# Patient Record
Sex: Female | Born: 2000 | Race: Black or African American | Hispanic: No | Marital: Single | Smoking: Never smoker
Health system: Southern US, Community
[De-identification: ages and names within clinical notes are randomized; demographics above are authoritative.]

## PROBLEM LIST (undated history)

## (undated) DIAGNOSIS — F32A Depression, unspecified: Secondary | ICD-10-CM

## (undated) DIAGNOSIS — F419 Anxiety disorder, unspecified: Secondary | ICD-10-CM

## (undated) HISTORY — DX: Depression, unspecified: F32.A

## (undated) HISTORY — PX: WRIST SURGERY: SHX841

## (undated) HISTORY — DX: Anxiety disorder, unspecified: F41.9

---
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
## 2018-02-21 ENCOUNTER — Ambulatory Visit (INDEPENDENT_AMBULATORY_CARE_PROVIDER_SITE_OTHER): Payer: 59 | Admitting: Women's Health

## 2018-02-21 ENCOUNTER — Encounter: Payer: Self-pay | Admitting: Women's Health

## 2018-02-21 VITALS — BP 124/80 | Ht 66.0 in | Wt 158.0 lb

## 2018-02-21 DIAGNOSIS — Z30016 Encounter for initial prescription of transdermal patch hormonal contraceptive device: Secondary | ICD-10-CM | POA: Diagnosis not present

## 2018-02-21 DIAGNOSIS — Z01419 Encounter for gynecological examination (general) (routine) without abnormal findings: Secondary | ICD-10-CM

## 2018-02-21 MED ORDER — NORELGESTROMIN-ETH ESTRADIOL 150-35 MCG/24HR TD PTWK: MEDICATED_PATCH | TRANSDERMAL | 4 refills | Status: DC

## 2018-02-21 NOTE — Progress Notes (Signed)
Lynn Austin December 09, 2000 [id number]    History:    Presents for annual exam.  Monthly cycle on Ortho Evra patch with good relief of dysmenorrhea.  Virgin.  Gardasil series completed.  Past medical history, past surgical history, family history and social history were all reviewed and documented in the EPIC chart.  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  Paternal aunt recently deceased from metastatic breast cancer.  Parents healthy.  ROS:  A ROS was performed and pertinent positives and negatives are included.  Exam:  Vitals:   02/21/18 1602  BP: 124/80  Weight: 158 lb (71.7 kg)  Height: 5\' 6"  (1.676 m)   Body mass index is 25.5 kg/m.   General appearance:  Normal Thyroid:  Symmetrical, normal in size, without palpable masses or nodularity. Respiratory  Auscultation:  Clear without wheezing or rhonchi Cardiovascular  Auscultation:  Regular rate, without rubs, murmurs or gallops  Edema/varicosities:  Not grossly evident Abdominal  Soft,nontender, without masses, guarding or rebound.  Liver/spleen:  No organomegaly noted  Hernia:  None appreciated  Skin  Inspection:  Grossly normal   Breasts: Examined lying and sitting.     Right: Without masses, retractions, discharge or axillary adenopathy.     Left: Without masses, retractions, discharge or axillary adenopathy. Gentitourinary   Inguinal/mons:  Normal without inguinal adenopathy  External genitalia:  Normal  BUS/Urethra/Skene's glands:  Normal  Vagina:  Normal  Cervix:  Normal  Uterus: normal in size, shape and contour.  Midline and mobile  Adnexa/parametria:     Rt: Without masses or tenderness.   Lt: Without masses or tenderness.  Anus and perineum: Normal   Assessment/Plan:  18 y.o. SBF virgin for annual exam with no complaints.  Monthly cycle and Ortho Evra good relief of dysmenorrhea  Plan: Ortho Evra patch prescription, proper use, slight risk for blood clots and strokes reviewed.  Condoms encouraged if sexually  active.  Campus and dating safety reviewed.  SBE's, exercise, calcium rich foods, MVI daily encouraged.  CBC.    Harrington Challenger [facility], 5:16 PM 02/21/2018

## 2018-02-21 NOTE — Patient Instructions (Signed)

## 2018-02-22 ENCOUNTER — Other Ambulatory Visit: Payer: Self-pay | Admitting: Women's Health

## 2018-02-22 DIAGNOSIS — D500 Iron deficiency anemia secondary to blood loss (chronic): Secondary | ICD-10-CM

## 2018-02-22 LAB — CBC WITH DIFFERENTIAL/PLATELET
Absolute Monocytes: 281 cells/uL (ref 200–950)
BASOS PCT: 0.2 %
Basophils Absolute: 11 cells/uL (ref 0–200)
EOS PCT: 5.3 %
Eosinophils Absolute: 292 cells/uL (ref 15–500)
HCT: 32.3 % — ABNORMAL LOW (ref 35.0–45.0)
Hemoglobin: 10 g/dL — ABNORMAL LOW (ref 11.7–15.5)
LYMPHS ABS: 2283 {cells}/uL (ref 850–3900)
MCH: 22.2 pg — ABNORMAL LOW (ref 27.0–33.0)
MCHC: 31 g/dL — ABNORMAL LOW (ref 32.0–36.0)
MCV: 71.8 fL — ABNORMAL LOW (ref 80.0–100.0)
MPV: 10.7 fL (ref 7.5–12.5)
Monocytes Relative: 5.1 %
NEUTROS PCT: 47.9 %
Neutro Abs: 2635 cells/uL (ref 1500–7800)
Platelets: 476 10*3/uL — ABNORMAL HIGH (ref 140–400)
RBC: 4.5 10*6/uL (ref 3.80–5.10)
RDW: 15.4 % — AB (ref 11.0–15.0)
TOTAL LYMPHOCYTE: 41.5 %
WBC: 5.5 10*3/uL (ref 3.8–10.8)

## 2018-02-22 LAB — URINALYSIS, COMPLETE W/RFL CULTURE
BACTERIA UA: NONE SEEN /HPF
BILIRUBIN URINE: NEGATIVE
Glucose, UA: NEGATIVE
Hgb urine dipstick: NEGATIVE
Hyaline Cast: NONE SEEN /LPF
KETONES UR: NEGATIVE
Leukocyte Esterase: NEGATIVE
Nitrites, Initial: NEGATIVE
RBC / HPF: NONE SEEN /HPF (ref 0–2)
Specific Gravity, Urine: 1.025 (ref 1.001–1.03)
WBC, UA: NONE SEEN /HPF (ref 0–5)
pH: >=8.5 — AB (ref 5.0–8.0)

## 2018-02-22 LAB — NO CULTURE INDICATED

## 2018-03-13 DIAGNOSIS — M67371 Transient synovitis, right ankle and foot: Secondary | ICD-10-CM | POA: Diagnosis not present

## 2018-03-13 DIAGNOSIS — M19071 Primary osteoarthritis, right ankle and foot: Secondary | ICD-10-CM | POA: Diagnosis not present

## 2018-03-22 DIAGNOSIS — M899 Disorder of bone, unspecified: Secondary | ICD-10-CM | POA: Diagnosis not present

## 2018-03-22 DIAGNOSIS — M67371 Transient synovitis, right ankle and foot: Secondary | ICD-10-CM | POA: Diagnosis not present

## 2018-04-05 DIAGNOSIS — M899 Disorder of bone, unspecified: Secondary | ICD-10-CM | POA: Diagnosis not present

## 2018-04-05 DIAGNOSIS — M67371 Transient synovitis, right ankle and foot: Secondary | ICD-10-CM | POA: Diagnosis not present

## 2018-04-16 DIAGNOSIS — M7751 Other enthesopathy of right foot: Secondary | ICD-10-CM | POA: Diagnosis not present

## 2018-04-16 DIAGNOSIS — M899 Disorder of bone, unspecified: Secondary | ICD-10-CM | POA: Diagnosis not present

## 2018-04-16 DIAGNOSIS — M67371 Transient synovitis, right ankle and foot: Secondary | ICD-10-CM | POA: Diagnosis not present

## 2018-04-26 DIAGNOSIS — M67371 Transient synovitis, right ankle and foot: Secondary | ICD-10-CM | POA: Diagnosis not present

## 2018-05-02 DIAGNOSIS — M899 Disorder of bone, unspecified: Secondary | ICD-10-CM | POA: Diagnosis not present

## 2018-06-22 ENCOUNTER — Other Ambulatory Visit: Payer: 59

## 2018-06-22 ENCOUNTER — Other Ambulatory Visit: Payer: Self-pay

## 2018-06-22 DIAGNOSIS — D500 Iron deficiency anemia secondary to blood loss (chronic): Secondary | ICD-10-CM

## 2018-06-23 LAB — CBC WITH DIFFERENTIAL/PLATELET
Absolute Monocytes: 370 cells/uL (ref 200–950)
Basophils Absolute: 22 cells/uL (ref 0–200)
Basophils Relative: 0.4 %
Eosinophils Absolute: 162 cells/uL (ref 15–500)
Eosinophils Relative: 2.9 %
HCT: 34.3 % — ABNORMAL LOW (ref 35.0–45.0)
Hemoglobin: 10.6 g/dL — ABNORMAL LOW (ref 11.7–15.5)
Lymphs Abs: 2822 cells/uL (ref 850–3900)
MCH: 23.2 pg — ABNORMAL LOW (ref 27.0–33.0)
MCHC: 30.9 g/dL — ABNORMAL LOW (ref 32.0–36.0)
MCV: 75.1 fL — ABNORMAL LOW (ref 80.0–100.0)
MPV: 9.6 fL (ref 7.5–12.5)
Monocytes Relative: 6.6 %
Neutro Abs: 2223 cells/uL (ref 1500–7800)
Neutrophils Relative %: 39.7 %
Platelets: 378 10*3/uL (ref 140–400)
RBC: 4.57 10*6/uL (ref 3.80–5.10)
RDW: 15.4 % — ABNORMAL HIGH (ref 11.0–15.0)
Total Lymphocyte: 50.4 %
WBC: 5.6 10*3/uL (ref 3.8–10.8)

## 2018-08-03 ENCOUNTER — Other Ambulatory Visit: Payer: Self-pay | Admitting: Women's Health

## 2018-08-03 DIAGNOSIS — Z30016 Encounter for initial prescription of transdermal patch hormonal contraceptive device: Secondary | ICD-10-CM

## 2019-02-25 ENCOUNTER — Encounter: Payer: 59 | Admitting: Women's Health

## 2019-04-09 ENCOUNTER — Other Ambulatory Visit: Payer: Self-pay

## 2019-04-10 ENCOUNTER — Encounter: Payer: Self-pay | Admitting: Women's Health

## 2019-04-10 ENCOUNTER — Ambulatory Visit (INDEPENDENT_AMBULATORY_CARE_PROVIDER_SITE_OTHER): Payer: 59 | Admitting: Women's Health

## 2019-04-10 ENCOUNTER — Other Ambulatory Visit: Payer: Self-pay

## 2019-04-10 VITALS — BP 124/80 | Wt 164.0 lb

## 2019-04-10 DIAGNOSIS — Z30016 Encounter for initial prescription of transdermal patch hormonal contraceptive device: Secondary | ICD-10-CM

## 2019-04-10 DIAGNOSIS — Z01419 Encounter for gynecological examination (general) (routine) without abnormal findings: Secondary | ICD-10-CM | POA: Diagnosis not present

## 2019-04-10 LAB — CBC WITH DIFFERENTIAL/PLATELET
Absolute Monocytes: 432 cells/uL (ref 200–950)
Basophils Absolute: 21 cells/uL (ref 0–200)
Basophils Relative: 0.4 %
Eosinophils Absolute: 322 cells/uL (ref 15–500)
Eosinophils Relative: 6.2 %
HCT: 34.5 % — ABNORMAL LOW (ref 35.0–45.0)
Hemoglobin: 10.4 g/dL — ABNORMAL LOW (ref 11.7–15.5)
Lymphs Abs: 2044 cells/uL (ref 850–3900)
MCH: 22.8 pg — ABNORMAL LOW (ref 27.0–33.0)
MCHC: 30.1 g/dL — ABNORMAL LOW (ref 32.0–36.0)
MCV: 75.7 fL — ABNORMAL LOW (ref 80.0–100.0)
MPV: 10.7 fL (ref 7.5–12.5)
Monocytes Relative: 8.3 %
Neutro Abs: 2382 cells/uL (ref 1500–7800)
Neutrophils Relative %: 45.8 %
Platelets: 387 10*3/uL (ref 140–400)
RBC: 4.56 10*6/uL (ref 3.80–5.10)
RDW: 13.6 % (ref 11.0–15.0)
Total Lymphocyte: 39.3 %
WBC: 5.2 10*3/uL (ref 3.8–10.8)

## 2019-04-10 MED ORDER — XULANE 150-35 MCG/24HR TD PTWK: MEDICATED_PATCH | TRANSDERMAL | 4 refills | Status: DC

## 2019-04-10 NOTE — Progress Notes (Signed)
Lynn Austin [birth year] [id number]    History:    Presents for annual exam.  Monthly cycle on Ortho Evra without complaint.  Virgin.  Gardasil series completed.  Past medical history, past surgical history, family history and social history were all reviewed and documented in the EPIC chart.  [employer] paramedic school doing well and enjoying.  Parents healthy.  ROS:  A ROS was performed and pertinent positives and negatives are included.  Exam:  Vitals:   04/10/19 1356  BP: 124/80  Weight: 164 lb (74.4 kg)   Body mass index is 26.47 kg/m.   General appearance:  Normal Thyroid:  Symmetrical, normal in size, without palpable masses or nodularity. Respiratory  Auscultation:  Clear without wheezing or rhonchi Cardiovascular  Auscultation:  Regular rate, without rubs, murmurs or gallops  Edema/varicosities:  Not grossly evident Abdominal  Soft,nontender, without masses, guarding or rebound.  Liver/spleen:  No organomegaly noted  Hernia:  None appreciated  Skin  Inspection:  Grossly normal   Breasts: Examined lying and sitting.     Right: Without masses, retractions, discharge or axillary adenopathy.     Left: Without masses, retractions, discharge or axillary adenopathy. Gentitourinary   Inguinal/mons:  Normal without inguinal adenopathy  External genitalia:  Normal  BUS/Urethra/Skene's glands:  Normal  Vagina:  Normal  Cervix:  Normal  Uterus:  normal in size, shape and contour.  Midline and mobile  Adnexa/parametria:     Rt: Without masses or tenderness.   Lt: Without masses or tenderness.  Anus and perineum: Normal   Assessment/Plan:  19 y.o. S BF virgin for annual exam with no complaints of discharge, urinary symptoms abdominal pain or fever.  Monthly cycle and Ortho Evra good relief of dysmenorrhea  Plan: Ortho Evra patch prescription, proper use, slight risk for blood clots and strokes reviewed.  Condoms encouraged if becomes sexually active.  SBEs, exercise,  calcium rich foods, MVI daily encouraged.  Campus and dating safety reviewed.  CBC.  Pap screening guidelines reviewed.    Harrington Challenger [facility], 3:33 PM 04/10/2019

## 2019-04-10 NOTE — Patient Instructions (Signed)
Pleasure knowing you! Health Maintenance, Female Adopting a healthy lifestyle and getting preventive care are important in promoting health and wellness. Ask your health care provider about:  The right schedule for you to have regular tests and exams.  Things you can do on your own to prevent diseases and keep yourself healthy. What should I know about diet, weight, and exercise? Eat a healthy diet   Eat a diet that includes plenty of vegetables, fruits, low-fat dairy products, and lean protein.  Do not eat a lot of foods that are high in solid fats, added sugars, or sodium. Maintain a healthy weight Body mass index (BMI) is used to identify weight problems. It estimates body fat based on height and weight. Your health care provider can help determine your BMI and help you achieve or maintain a healthy weight. Get regular exercise Get regular exercise. This is one of the most important things you can do for your health. Most adults should:  Exercise for at least 150 minutes each week. The exercise should increase your heart rate and make you sweat (moderate-intensity exercise).  Do strengthening exercises at least twice a week. This is in addition to the moderate-intensity exercise.  Spend less time sitting. Even light physical activity can be beneficial. Watch cholesterol and blood lipids Have your blood tested for lipids and cholesterol at 21 years of age, then have this test every 5 years. Have your cholesterol levels checked more often if:  Your lipid or cholesterol levels are high.  You are older than 21 years of age.  You are at high risk for heart disease. What should I know about cancer screening? Depending on your health history and family history, you may need to have cancer screening at various ages. This may include screening for:  Breast cancer.  Cervical cancer.  Colorectal cancer.  Skin cancer.  Lung cancer. What should I know about heart disease, diabetes,  and high blood pressure? Blood pressure and heart disease  High blood pressure causes heart disease and increases the risk of stroke. This is more likely to develop in people who have high blood pressure readings, are of African descent, or are overweight.  Have your blood pressure checked: ? Every 3-5 years if you are 30-35 years of age. ? Every year if you are 64 years old or older. Diabetes Have regular diabetes screenings. This checks your fasting blood sugar level. Have the screening done:  Once every three years after age 18 if you are at a normal weight and have a low risk for diabetes.  More often and at a younger age if you are overweight or have a high risk for diabetes. What should I know about preventing infection? Hepatitis B If you have a higher risk for hepatitis B, you should be screened for this virus. Talk with your health care provider to find out if you are at risk for hepatitis B infection. Hepatitis C Testing is recommended for:  Everyone born from [birth year] through 1967.  Anyone with known risk factors for hepatitis C. Sexually transmitted infections (STIs)  Get screened for STIs, including gonorrhea and chlamydia, if: ? You are sexually active and are younger than 21 years of age. ? You are older than 21 years of age and your health care provider tells you that you are at risk for this type of infection. ? Your sexual activity has changed since you were last screened, and you are at increased risk for chlamydia or gonorrhea. Ask your health  care provider if you are at risk.  Ask your health care provider about whether you are at high risk for HIV. Your health care provider may recommend a prescription medicine to help prevent HIV infection. If you choose to take medicine to prevent HIV, you should first get tested for HIV. You should then be tested every 3 months for as long as you are taking the medicine. Pregnancy  If you are about to stop having your period  (premenopausal) and you may become pregnant, seek counseling before you get pregnant.  Take 400 to 800 micrograms (mcg) of folic acid every day if you become pregnant.  Ask for birth control (contraception) if you want to prevent pregnancy. Osteoporosis and menopause Osteoporosis is a disease in which the bones lose minerals and strength with aging. This can result in bone fractures. If you are 24 years old or older, or if you are at risk for osteoporosis and fractures, ask your health care provider if you should:  Be screened for bone loss.  Take a calcium or vitamin D supplement to lower your risk of fractures.  Be given hormone replacement therapy (HRT) to treat symptoms of menopause. Follow these instructions at home: Lifestyle  Do not use any products that contain nicotine or tobacco, such as cigarettes, e-cigarettes, and chewing tobacco. If you need help quitting, ask your health care provider.  Do not use street drugs.  Do not share needles.  Ask your health care provider for help if you need support or information about quitting drugs. Alcohol use  Do not drink alcohol if: ? Your health care provider tells you not to drink. ? You are pregnant, may be pregnant, or are planning to become pregnant.  If you drink alcohol: ? Limit how much you use to 0-1 drink a day. ? Limit intake if you are breastfeeding.  Be aware of how much alcohol is in your drink. In the [place] one drink equals one 12 oz bottle of beer (355 mL), one 5 oz glass of wine (148 mL), or one 1 oz glass of hard liquor (44 mL). General instructions  Schedule regular health, dental, and eye exams.  Stay current with your vaccines.  Tell your health care provider if: ? You often feel depressed. ? You have ever been abused or do not feel safe at home. Summary  Adopting a healthy lifestyle and getting preventive care are important in promoting health and wellness.  Follow your health care provider's  instructions about healthy diet, exercising, and getting tested or screened for diseases.  Follow your health care provider's instructions on monitoring your cholesterol and blood pressure. This information is not intended to replace advice given to you by your health care provider. Make sure you discuss any questions you have with your health care provider. Document Revised: 12/20/2017 Document Reviewed: 12/20/2017 Elsevier Patient Education  2020 [employer].

## 2019-04-12 LAB — URINALYSIS, COMPLETE W/RFL CULTURE
Bacteria, UA: NONE SEEN /HPF
Bilirubin Urine: NEGATIVE
Glucose, UA: NEGATIVE
Hgb urine dipstick: NEGATIVE
Hyaline Cast: NONE SEEN /LPF
Ketones, ur: NEGATIVE
Leukocyte Esterase: NEGATIVE
Nitrites, Initial: NEGATIVE
Specific Gravity, Urine: 1.023 (ref 1.001–1.03)
Squamous Epithelial / LPF: NONE SEEN /HPF (ref <=5)
WBC, UA: NONE SEEN /HPF (ref 0–5)
pH: 7.5 (ref 5.0–8.0)

## 2019-04-12 LAB — URINE CULTURE
MICRO NUMBER:: 10315184
SPECIMEN QUALITY:: ADEQUATE

## 2019-04-12 LAB — CULTURE INDICATED

## 2020-03-16 ENCOUNTER — Ambulatory Visit
Admission: RE | Admit: 2020-03-16 | Discharge: 2020-03-16 | Disposition: A | Discharge status: Home / Self Care | Payer: 59 | Source: Ambulatory Visit | Attending: Family Medicine | Admitting: Family Medicine

## 2020-03-16 ENCOUNTER — Other Ambulatory Visit: Payer: Self-pay | Admitting: Family Medicine

## 2020-03-16 DIAGNOSIS — R0789 Other chest pain: Secondary | ICD-10-CM

## 2020-04-13 ENCOUNTER — Other Ambulatory Visit (HOSPITAL_COMMUNITY)
Admission: RE | Admit: 2020-04-13 | Discharge: 2020-04-13 | Disposition: A | Discharge status: Home / Self Care | Payer: 59 | Source: Ambulatory Visit | Attending: Nurse Practitioner | Admitting: Nurse Practitioner

## 2020-04-13 ENCOUNTER — Encounter: Payer: Self-pay | Admitting: Nurse Practitioner

## 2020-04-13 ENCOUNTER — Ambulatory Visit: Payer: 59 | Admitting: Nurse Practitioner

## 2020-04-13 ENCOUNTER — Other Ambulatory Visit: Payer: Self-pay

## 2020-04-13 VITALS — BP 116/76 | Ht 67.0 in | Wt 166.0 lb

## 2020-04-13 DIAGNOSIS — Z3045 Encounter for surveillance of transdermal patch hormonal contraceptive device: Secondary | ICD-10-CM | POA: Diagnosis not present

## 2020-04-13 DIAGNOSIS — Z01419 Encounter for gynecological examination (general) (routine) without abnormal findings: Secondary | ICD-10-CM

## 2020-04-13 MED ORDER — XULANE 150-35 MCG/24HR TD PTWK: MEDICATED_PATCH | TRANSDERMAL | 4 refills | Status: DC

## 2020-04-13 NOTE — Patient Instructions (Signed)
Health Maintenance, Female Adopting a healthy lifestyle and getting preventive care are important in promoting health and wellness. Ask your health care provider about:  The right schedule for you to have regular tests and exams.  Things you can do on your own to prevent diseases and keep yourself healthy. What should I know about diet, weight, and exercise? Eat a healthy diet  Eat a diet that includes plenty of vegetables, fruits, low-fat dairy products, and lean protein.  Do not eat a lot of foods that are high in solid fats, added sugars, or sodium.   Maintain a healthy weight Body mass index (BMI) is used to identify weight problems. It estimates body fat based on height and weight. Your health care provider can help determine your BMI and help you achieve or maintain a healthy weight. Get regular exercise Get regular exercise. This is one of the most important things you can do for your health. Most adults should:  Exercise for at least 150 minutes each week. The exercise should increase your heart rate and make you sweat (moderate-intensity exercise).  Do strengthening exercises at least twice a week. This is in addition to the moderate-intensity exercise.  Spend less time sitting. Even light physical activity can be beneficial. Watch cholesterol and blood lipids Have your blood tested for lipids and cholesterol at 22 years of age, then have this test every 5 years. Have your cholesterol levels checked more often if:  Your lipid or cholesterol levels are high.  You are older than 22 years of age.  You are at high risk for heart disease. What should I know about cancer screening? Depending on your health history and family history, you may need to have cancer screening at various ages. This may include screening for:  Breast cancer.  Cervical cancer.  Colorectal cancer.  Skin cancer.  Lung cancer. What should I know about heart disease, diabetes, and high blood  pressure? Blood pressure and heart disease  High blood pressure causes heart disease and increases the risk of stroke. This is more likely to develop in people who have high blood pressure readings, are of African descent, or are overweight.  Have your blood pressure checked: ? Every 3-5 years if you are 18-39 years of age. ? Every year if you are 40 years old or older. Diabetes Have regular diabetes screenings. This checks your fasting blood sugar level. Have the screening done:  Once every three years after age 40 if you are at a normal weight and have a low risk for diabetes.  More often and at a younger age if you are overweight or have a high risk for diabetes. What should I know about preventing infection? Hepatitis B If you have a higher risk for hepatitis B, you should be screened for this virus. Talk with your health care provider to find out if you are at risk for hepatitis B infection. Hepatitis C Testing is recommended for:  Everyone born from 1945 through 1965.  Anyone with known risk factors for hepatitis C. Sexually transmitted infections (STIs)  Get screened for STIs, including gonorrhea and chlamydia, if: ? You are sexually active and are younger than 22 years of age. ? You are older than 22 years of age and your health care provider tells you that you are at risk for this type of infection. ? Your sexual activity has changed since you were last screened, and you are at increased risk for chlamydia or gonorrhea. Ask your health care provider   if you are at risk.  Ask your health care provider about whether you are at high risk for HIV. Your health care provider may recommend a prescription medicine to help prevent HIV infection. If you choose to take medicine to prevent HIV, you should first get tested for HIV. You should then be tested every 3 months for as long as you are taking the medicine. Pregnancy  If you are about to stop having your period (premenopausal) and  you may become pregnant, seek counseling before you get pregnant.  Take 400 to 800 micrograms (mcg) of folic acid every day if you become pregnant.  Ask for birth control (contraception) if you want to prevent pregnancy. Osteoporosis and menopause Osteoporosis is a disease in which the bones lose minerals and strength with aging. This can result in bone fractures. If you are 65 years old or older, or if you are at risk for osteoporosis and fractures, ask your health care provider if you should:  Be screened for bone loss.  Take a calcium or vitamin D supplement to lower your risk of fractures.  Be given hormone replacement therapy (HRT) to treat symptoms of menopause. Follow these instructions at home: Lifestyle  Do not use any products that contain nicotine or tobacco, such as cigarettes, e-cigarettes, and chewing tobacco. If you need help quitting, ask your health care provider.  Do not use street drugs.  Do not share needles.  Ask your health care provider for help if you need support or information about quitting drugs. Alcohol use  Do not drink alcohol if: ? Your health care provider tells you not to drink. ? You are pregnant, may be pregnant, or are planning to become pregnant.  If you drink alcohol: ? Limit how much you use to 0-1 drink a day. ? Limit intake if you are breastfeeding.  Be aware of how much alcohol is in your drink. In the [place] one drink equals one 12 oz bottle of beer (355 mL), one 5 oz glass of wine (148 mL), or one 1 oz glass of hard liquor (44 mL). General instructions  Schedule regular health, dental, and eye exams.  Stay current with your vaccines.  Tell your health care provider if: ? You often feel depressed. ? You have ever been abused or do not feel safe at home. Summary  Adopting a healthy lifestyle and getting preventive care are important in promoting health and wellness.  Follow your health care provider's instructions about healthy  diet, exercising, and getting tested or screened for diseases.  Follow your health care provider's instructions on monitoring your cholesterol and blood pressure. This information is not intended to replace advice given to you by your health care provider. Make sure you discuss any questions you have with your health care provider. Document Revised: 12/20/2017 Document Reviewed: 12/20/2017 Elsevier Patient Education  2021 Elsevier Inc.  

## 2020-04-13 NOTE — Progress Notes (Signed)
   GIULLIANA MCROBERTS 12-Apr-2000 [id number]   History:  20 y.o. G0 presents for annual exam. No GYN complaints. Monthly cycle, on Xulane patch. Has received Gardasil series. [place].  Gynecologic History Patient's last menstrual period was 03/25/2020. Period Cycle (Days): 28 Period Duration (Days): 5 Period Pattern: Regular Menstrual Flow: Moderate Dysmenorrhea: (!) Mild Dysmenorrhea Symptoms: Cramping Contraception/Family planning: abstinence, Xulane patch  Health Maintenance Last Pap: Never Last mammogram: N/A Last colonoscopy: N/A Last Dexa: N/A   Past medical history, past surgical history, family history and social history were all reviewed and documented in the EPIC chart. EMT, working on paramedic.   ROS:  A ROS was performed and pertinent positives and negatives are included.  Exam:  Vitals:   04/13/20 1523  BP: 116/76  Weight: 166 lb (75.3 kg)  Height: 5\' 7"  (1.702 m)   Body mass index is 26 kg/m.  General appearance:  Normal Thyroid:  Symmetrical, normal in size, without palpable masses or nodularity. Respiratory  Auscultation:  Clear without wheezing or rhonchi Cardiovascular  Auscultation:  Regular rate, without rubs, murmurs or gallops  Edema/varicosities:  Not grossly evident Abdominal  Soft,nontender, without masses, guarding or rebound.  Liver/spleen:  No organomegaly noted  Hernia:  None appreciated  Skin  Inspection:  Grossly normal   Breasts: Examined lying and sitting.   Right: Without masses, retractions, discharge or axillary adenopathy.   Left: Without masses, retractions, discharge or axillary adenopathy. Gentitourinary   Inguinal/mons:  Normal without inguinal adenopathy  External genitalia:  Normal  BUS/Urethra/Skene's glands:  Normal  Vagina:  Normal  Cervix:  Normal  Uterus:  Normal in size, shape and contour.  Midline and mobile  Adnexa/parametria:     Rt: Without masses or tenderness.   Lt: Without masses or tenderness.  Anus  and perineum: Normal  Assessment/Plan:  20 y.o. G0 for annual exam.   Well female exam with routine gynecological exam - Plan: Cytology - PAP( [place]). Education provided on SBEs, importance of preventative screenings, current guidelines, high calcium diet, safe sex, regular exercise, and multivitamin daily.  Encounter for surveillance of transdermal patch hormonal contraceptive device - Plan: norelgestromin-ethinyl estradiol 36) 150-35 MCG/24HR transdermal patch weekly. Using as prescribed. Refill x 1 year provided.   Screening for cervical cancer - Pap - cytology only today.   Return in 1 year for annual.      Burr Medico DNP, 3:35 PM 04/13/2020

## 2020-04-14 ENCOUNTER — Encounter: Payer: Self-pay | Admitting: Nurse Practitioner

## 2020-04-14 LAB — CYTOLOGY - PAP: Diagnosis: NEGATIVE

## 2020-06-24 ENCOUNTER — Telehealth: Payer: Self-pay | Admitting: *Deleted

## 2020-06-24 DIAGNOSIS — Z3045 Encounter for surveillance of transdermal patch hormonal contraceptive device: Secondary | ICD-10-CM

## 2020-06-24 MED ORDER — XULANE 150-35 MCG/24HR TD PTWK: MEDICATED_PATCH | TRANSDERMAL | 3 refills | Status: DC

## 2020-06-24 NOTE — Telephone Encounter (Signed)
[facility] called stating patient does not have any refills at the pharmacy for Xulane 150-35 mg patch. It appears [facility] does not have correct address nor insurance information for patch. I called patient and explained this to her as well. Patient aware to reach to the pharmacy regarding this.

## 2020-06-24 NOTE — Telephone Encounter (Signed)
Patient called back stating she has spoke with [facility] and they have fixed the problem. The patient said she would like the patches to stay at her father's address [address] [place]. She asked that I resend the Rx to mail order. Rx sent.

## 2020-06-25 ENCOUNTER — Other Ambulatory Visit: Payer: Self-pay

## 2020-07-15 ENCOUNTER — Telehealth: Payer: Self-pay | Admitting: *Deleted

## 2020-07-15 DIAGNOSIS — Z3045 Encounter for surveillance of transdermal patch hormonal contraceptive device: Secondary | ICD-10-CM

## 2020-07-15 MED ORDER — XULANE 150-35 MCG/24HR TD PTWK: MEDICATED_PATCH | TRANSDERMAL | 3 refills | Status: DC

## 2020-07-15 MED ORDER — XULANE 150-35 MCG/24HR TD PTWK: 1.0000 | MEDICATED_PATCH | TRANSDERMAL | 0 refills | Status: DC

## 2020-07-15 NOTE — Telephone Encounter (Signed)
[facility] called states she received a letter from [facility] stating her Rx for Xulane patch has been canceled. She asked I send in 1 month supply to Xulane 150-mcg  and a new Rx for long term Xulane patch sent to mail order. Rx sent.

## 2020-08-04 ENCOUNTER — Other Ambulatory Visit: Payer: Self-pay | Admitting: Nurse Practitioner

## 2020-08-04 MED ORDER — XULANE 150-35 MCG/24HR TD PTWK: 1.0000 | MEDICATED_PATCH | TRANSDERMAL | 3 refills | Status: DC

## 2020-08-04 NOTE — Telephone Encounter (Signed)
Pharmacy called back about the denial of Xulane patch. They did receive the Rx for July, however only #3 patches prescribed, patient will need next week. Annual exam scheduled on 04/13/20 refills sent.

## 2021-04-14 ENCOUNTER — Ambulatory Visit: Payer: 59 | Admitting: Nurse Practitioner

## 2021-05-26 ENCOUNTER — Ambulatory Visit (INDEPENDENT_AMBULATORY_CARE_PROVIDER_SITE_OTHER): Payer: 59 | Admitting: Nurse Practitioner

## 2021-05-26 ENCOUNTER — Encounter: Payer: Self-pay | Admitting: Nurse Practitioner

## 2021-05-26 VITALS — BP 112/66 | Ht 66.0 in | Wt 178.0 lb

## 2021-05-26 DIAGNOSIS — Z01419 Encounter for gynecological examination (general) (routine) without abnormal findings: Secondary | ICD-10-CM

## 2021-05-26 DIAGNOSIS — N898 Other specified noninflammatory disorders of vagina: Secondary | ICD-10-CM

## 2021-05-26 DIAGNOSIS — Z3045 Encounter for surveillance of transdermal patch hormonal contraceptive device: Secondary | ICD-10-CM

## 2021-05-26 DIAGNOSIS — Z0000 Encounter for general adult medical examination without abnormal findings: Secondary | ICD-10-CM

## 2021-05-26 LAB — WET PREP FOR TRICH, YEAST, CLUE

## 2021-05-26 MED ORDER — XULANE 150-35 MCG/24HR TD PTWK: 1.0000 | MEDICATED_PATCH | TRANSDERMAL | 3 refills | Status: DC

## 2021-05-26 NOTE — Progress Notes (Signed)
? ?  Lynn Austin 2000/12/16 [id number] ? ? ?History:  [name] presents for annual exam. Complains of intermittent vaginal discharge and odor. Monthly cycle, on Xulane patch. Has received Gardasil series. ? ?Gynecologic History ?Patient's last menstrual period was 05/16/2021. ?Period Cycle (Days): 28 ?Period Duration (Days): 5 ?Period Pattern: Regular ?Menstrual Flow: Moderate ?Dysmenorrhea: (!) Mild ?Dysmenorrhea Symptoms: Cramping ?Contraception/Family planning: abstinence, Xulane patch ?Sexually active: No ? ?Health Maintenance ?Last Pap: 04/13/2020. Results were: Normal ?Last mammogram: Not indicated ?Last colonoscopy: Not indicated ?Last Dexa: Not indicated  ? 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 ? ?ROS:  A ROS was performed and pertinent positives and negatives are included. ? ?Exam: ? ?Vitals:  ? 05/26/21 1424  ?BP: 112/66  ?Weight: 178 lb (80.7 kg)  ?Height: 5\' 6"  (1.676 m)  ? ? ?Body mass index is 28.73 kg/m?. ? ?General appearance:  Normal ?Thyroid:  Symmetrical, normal in size, without palpable masses or nodularity. ?Respiratory ? Auscultation:  Clear without wheezing or rhonchi ?Cardiovascular ? Auscultation:  Regular rate, without rubs, murmurs or gallops ? Edema/varicosities:  Not grossly evident ?Abdominal ? Soft,nontender, without masses, guarding or rebound. ? Liver/spleen:  No organomegaly noted ? Hernia:  None appreciated ? Skin ? Inspection:  Grossly normal ?  ?Breasts: Examined lying and sitting.  ? Right: Without masses, retractions, nipple discharge or axillary adenopathy. ? ? Left: Without masses, retractions, nipple discharge or axillary adenopathy. ?Genitourinary  ? Inguinal/mons:  Normal without inguinal adenopathy ? External genitalia:  Normal appearing vulva with no masses, tenderness, or lesions ? BUS/Urethra/Skene's glands:  Normal ? Vagina:  Normal appearing with normal color  and discharge, no lesions ? Cervix:  Normal appearing without discharge or lesions ? Uterus:  Normal in size, shape and contour.  Midline and mobile, nontender ? Adnexa/parametria:   ?  Rt: Normal in size, without masses or tenderness. ?  Lt: Normal in size, without masses or tenderness. ? Anus and perineum: Normal ? Digital rectal exam: Not indicated ? ?Patient informed chaperone available to be present for breast and pelvic exam. Patient has requested no chaperone to be present. Patient has been advised what will be completed during breast and pelvic exam.  ? ?Wet prep negative ? ?Assessment/Plan:  [name] for annual exam.  ? ?Well female exam with routine gynecological exam - Education provided on SBEs, importance of preventative screenings, current guidelines, high calcium diet, safe sex, regular exercise, and multivitamin daily. ? ?Encounter for surveillance of transdermal patch hormonal contraceptive device - Plan: norelgestromin-ethinyl estradiol 21) 150-35 MCG/24HR transdermal patch weekly. Using as prescribed. Refill x 1 year provided.  ? ?Vaginal discharge - Plan: WET PREP FOR TRICH, YEAST, CLUE. Negative wet prep and exam.  ? ?Screening for cervical cancer - Normal pap history. Will repeat at 3-year interval per guidelines.  ? ?Return in 1 year for annual.  ? ? ? ? ?Burr Medico DNP, 2:55 PM 05/26/2021 ? ?

## 2021-06-19 ENCOUNTER — Other Ambulatory Visit: Payer: Self-pay | Admitting: Nurse Practitioner

## 2021-06-19 DIAGNOSIS — Z3045 Encounter for surveillance of transdermal patch hormonal contraceptive device: Secondary | ICD-10-CM

## 2021-07-06 ENCOUNTER — Telehealth: Payer: Self-pay

## 2021-07-06 DIAGNOSIS — Z3045 Encounter for surveillance of transdermal patch hormonal contraceptive device: Secondary | ICD-10-CM

## 2021-07-06 NOTE — Telephone Encounter (Signed)
Per rep @ [facility]: She said that we had too many differences in pt's demographics/insurance. Pt needs to update with pharmacy and with [place] and once that is done, we can resend Rx. Will notify pt via mychart msg per her request.

## 2021-07-15 NOTE — Telephone Encounter (Signed)
Called to f/u w/ pt, no answer and mailbox is full.

## 2021-07-22 MED ORDER — XULANE 150-35 MCG/24HR TD PTWK: 1.0000 | MEDICATED_PATCH | TRANSDERMAL | 0 refills | Status: DC

## 2021-07-22 NOTE — Telephone Encounter (Signed)
Signed. Thank you.

## 2021-07-22 NOTE — Telephone Encounter (Signed)
Patient called. I told her Lynn Austin, CMA had tried to call her as well as sent My Chart message. I read her the My Chart message. She said she had this same problem with [facility] last year and straightened it out. She will call them again and get it corrected. She will message back or call once she does so we can resent Rx.  Meantime she needs Rx for one pack to hold her until she can straighten the mail order pharmacy issue out.

## 2021-08-12 IMAGING — CR DG CHEST 2V
2 series · 2 of 2 positions shown · non-contrast
Comparison: None.

CLINICAL DATA: Chest wall pain, parasternal, no known trauma

EXAM:
CHEST - 2 VIEW

[w chest pa]
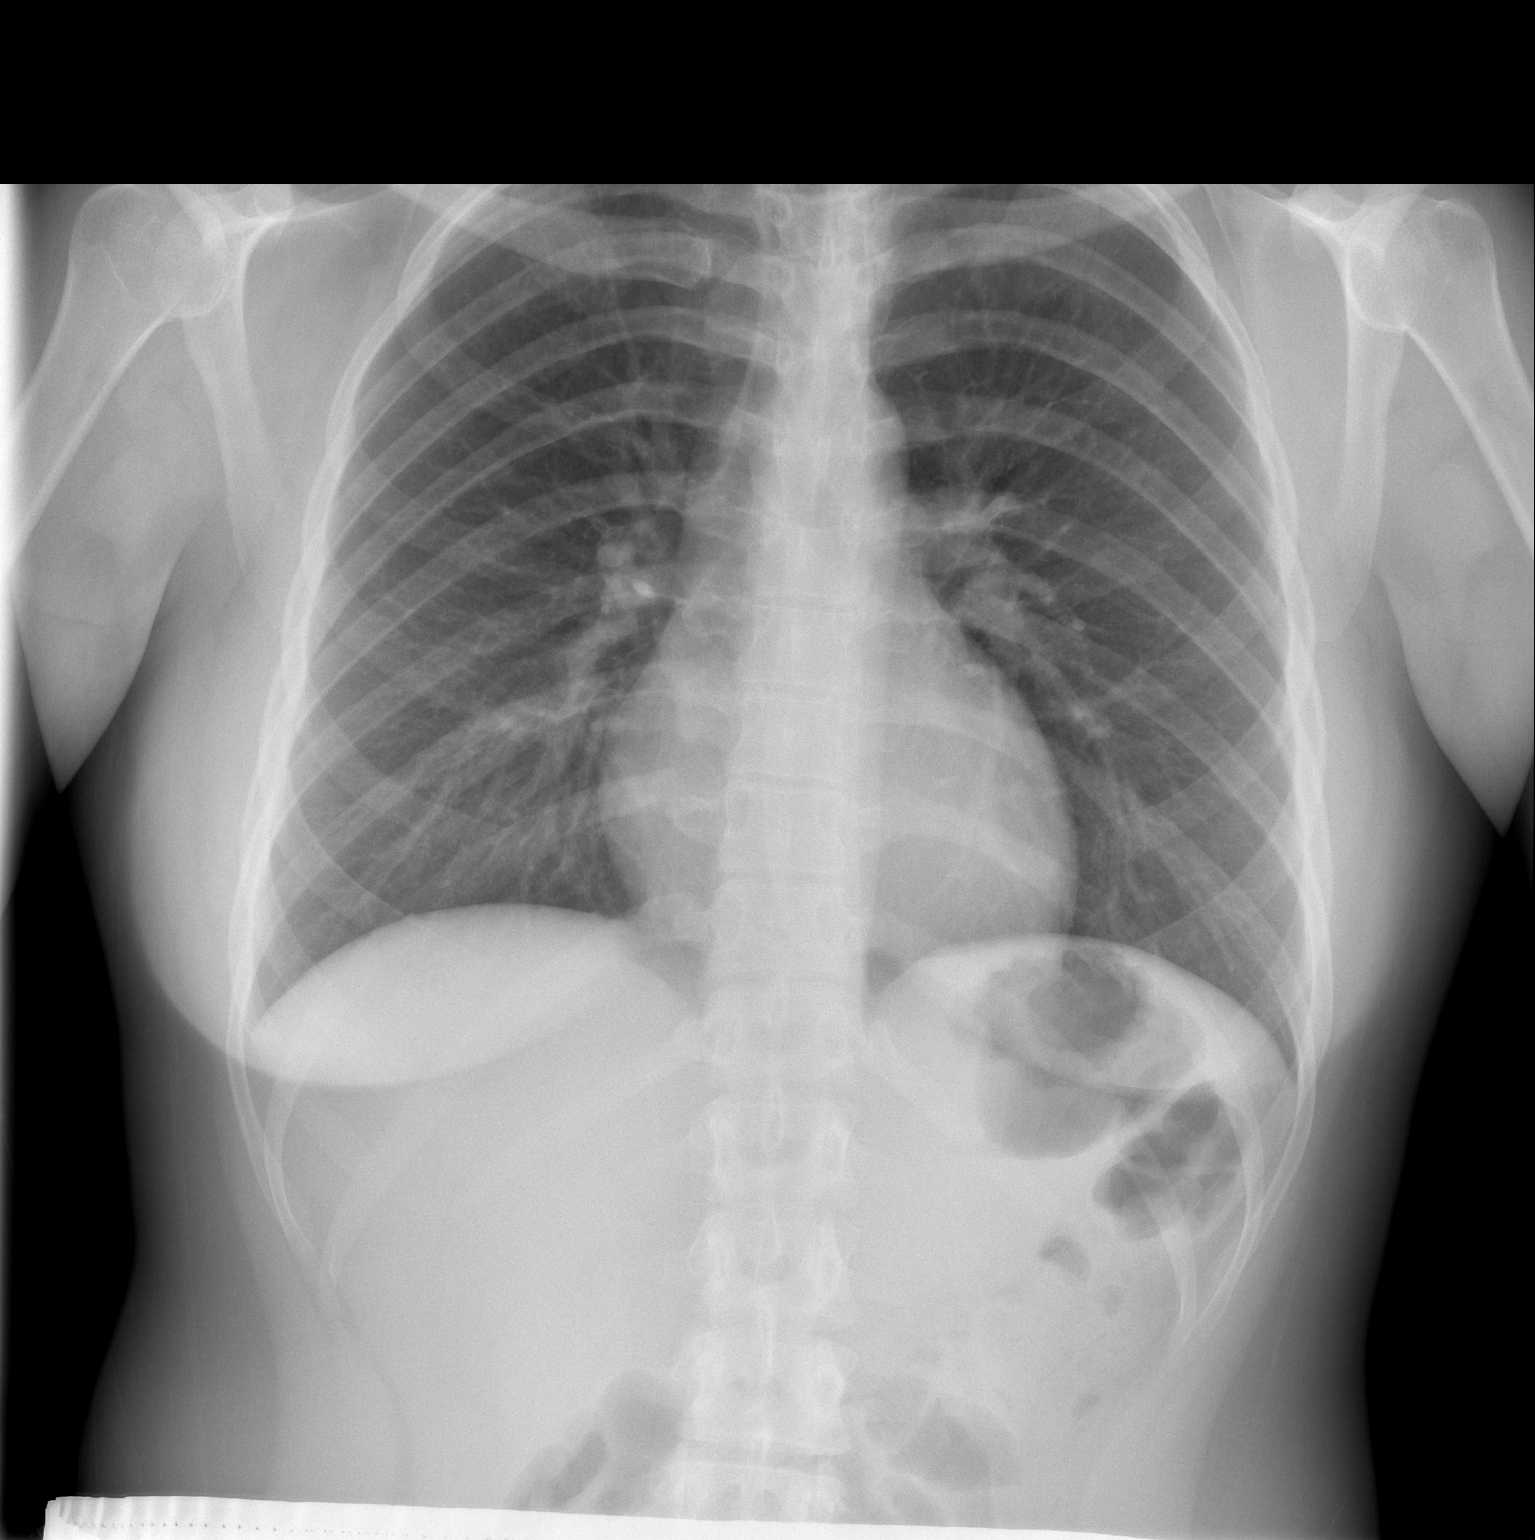

[w chest lat]
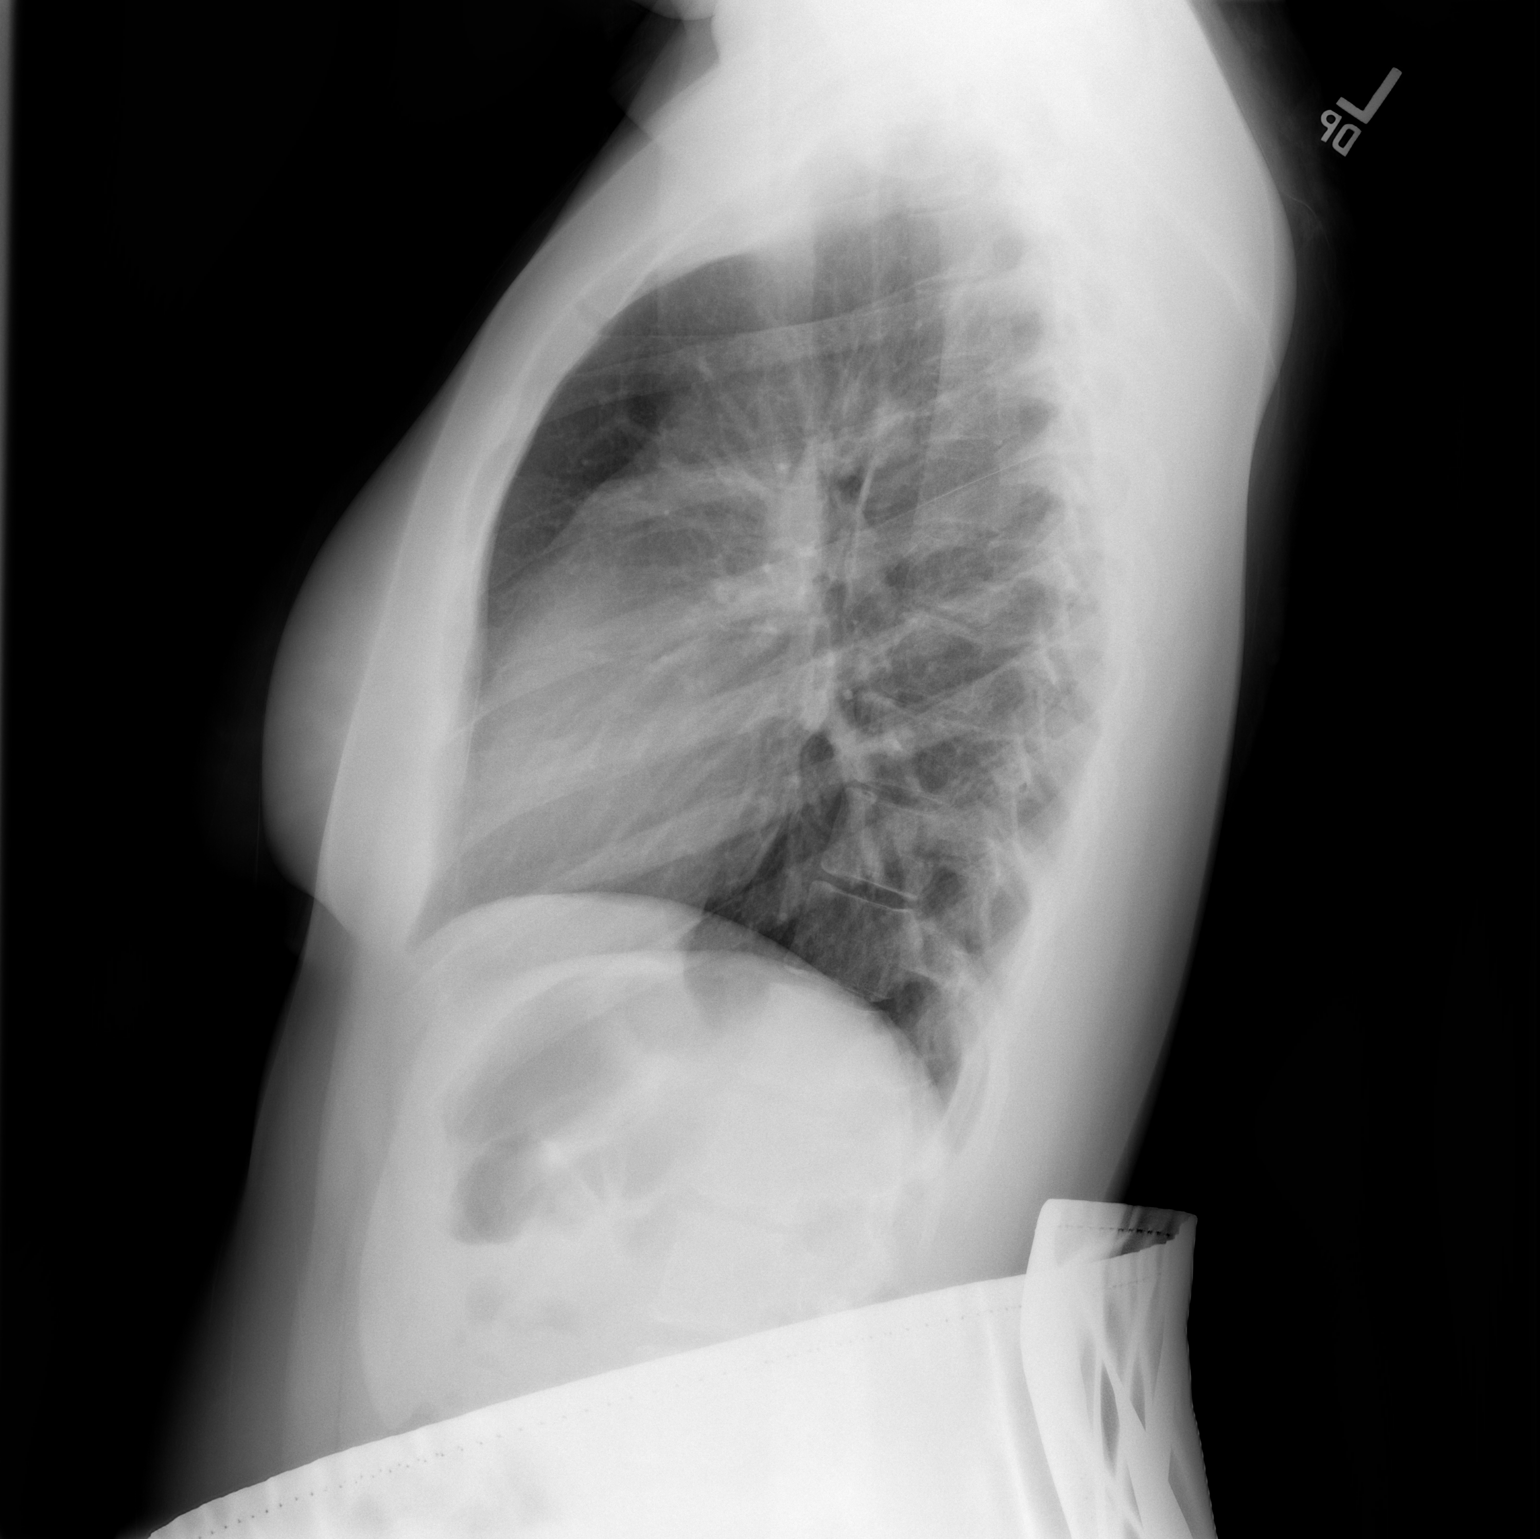

[2 of 2 positions shown; findings below may reference images not displayed]

FINDINGS: No consolidation, features of edema, pneumothorax, or effusion. The
cardiomediastinal contours are unremarkable. No acute or worrisome
chest wall abnormalities within the limitations of this two view
radiograph.
IMPRESSION: No acute cardiopulmonary abnormality.

No chest wall abnormalities within limitations of a two view
radiograph.

## 2022-02-09 ENCOUNTER — Encounter (HOSPITAL_BASED_OUTPATIENT_CLINIC_OR_DEPARTMENT_OTHER): Payer: Self-pay

## 2022-02-09 ENCOUNTER — Emergency Department (HOSPITAL_BASED_OUTPATIENT_CLINIC_OR_DEPARTMENT_OTHER): Payer: 59 | Admitting: Radiology

## 2022-02-09 ENCOUNTER — Emergency Department (HOSPITAL_BASED_OUTPATIENT_CLINIC_OR_DEPARTMENT_OTHER): Payer: 59

## 2022-02-09 ENCOUNTER — Other Ambulatory Visit: Payer: Self-pay

## 2022-02-09 ENCOUNTER — Emergency Department (HOSPITAL_BASED_OUTPATIENT_CLINIC_OR_DEPARTMENT_OTHER)
Admission: EM | Admit: 2022-02-09 | Discharge: 2022-02-09 | Disposition: A | Discharge status: Home / Self Care | Payer: No Typology Code available for payment source | Attending: Emergency Medicine | Admitting: Emergency Medicine

## 2022-02-09 DIAGNOSIS — W228XXA Striking against or struck by other objects, initial encounter: Secondary | ICD-10-CM | POA: Diagnosis not present

## 2022-02-09 DIAGNOSIS — M25522 Pain in left elbow: Secondary | ICD-10-CM | POA: Diagnosis not present

## 2022-02-09 MED ORDER — IBUPROFEN 800 MG PO TABS
800.0000 mg | ORAL_TABLET | Freq: Once | ORAL | Status: AC
Start: 2022-02-09 — End: 2022-02-09
  Administered 2022-02-09: 800 mg via ORAL
  Filled 2022-02-09: qty 1

## 2022-02-09 NOTE — ED Provider Notes (Signed)
[facility] Provider Note   CSN: [id number] Arrival date & time: 02/09/22  2118     History  Chief Complaint  Patient presents with   Elbow Injury    Lynn Austin is a 22 y.o. female.  HPI   22 year old female presenting to the emergency department with left elbow pain.  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  She has since had pain with range of motion and tenderness to palpation of the olecranon along her elbow.  She denies any loss of sensation or motor function in the left upper extremity.  She is right-handed.  She denies any other injury or trauma.  Home Medications Prior to Admission medications   Medication Sig Start Date End Date Taking? Authorizing Provider  Cetirizine HCl (ZYRTEC PO) Take by mouth.    [provider]  escitalopram (LEXAPRO) 20 MG tablet Take 20 mg by mouth daily. 03/31/21   [provider]  hydrOXYzine (ATARAX) 25 MG tablet Take 12.5-25 mg by mouth every 8 (eight) hours as needed. 02/05/21   [provider]  ibuprofen (ADVIL,MOTRIN) 200 MG tablet Take 600 mg by mouth every 6 (six) hours as needed for moderate pain.    [provider]  Multiple Vitamin (MULTIVITAMIN) capsule Take 1 capsule by mouth daily.    [provider]  norelgestromin-ethinyl estradiol Marilu Favre) 150-35 MCG/24HR transdermal patch Place 1 patch onto the skin once a week. 05/26/21   Tamela Gammon, NP  norelgestromin-ethinyl estradiol Marilu Favre) 150-35 MCG/24HR transdermal patch Place 1 patch onto the skin once a week. 07/22/21   Tamela Gammon, NP      Allergies    Amoxicillin and Fruit & vegetable daily [nutritional supplements]    Review of Systems   Review of Systems  All other systems reviewed and are negative.   Physical Exam Updated Vital Signs BP 112/82   Pulse 73   Temp 98.2 F (36.8 C)   Resp  18   Ht 5\' 6"  (1.676 m)   Wt 77.1 kg   LMP 01/12/2022 (Approximate)   SpO2 100%   BMI 27.44 kg/m  Physical Exam Vitals and nursing note reviewed.  Constitutional:      General: She is not in acute distress. HENT:     Head: Normocephalic and atraumatic.  Eyes:     Conjunctiva/sclera: Conjunctivae normal.     Pupils: Pupils are equal, round, and reactive to light.  Cardiovascular:     Rate and Rhythm: Normal rate and regular rhythm.  Pulmonary:     Effort: Pulmonary effort is normal. No respiratory distress.  Abdominal:     General: There is no distension.     Tenderness: There is no guarding.  Musculoskeletal:        General: No deformity or signs of injury.     Cervical back: Neck supple.     Comments: Left upper extremity neurovascularly intact with 2+ radial pulses, intact motor function along the median, ulnar, radial nerve distributions.  No tenderness about the medial or lateral epicondyles, tenderness upon direct palpation of the olecranon.  Intact passive range of motion.  Skin:    Findings: No lesion or rash.  Neurological:     General: No focal deficit present.     Mental Status: She is alert. Mental status is at baseline.     ED Results / Procedures /  Treatments   Labs (all labs ordered are listed, but only abnormal results are displayed) Labs Reviewed - No data to display  EKG None  Radiology CT Elbow Left Wo Contrast  Result Date: 02/09/2022 CLINICAL DATA:  Elbow trauma. Pain. EXAM: CT OF THE UPPER LEFT EXTREMITY WITHOUT CONTRAST TECHNIQUE: Multidetector CT imaging of the upper left extremity was performed according to the standard protocol. RADIATION DOSE REDUCTION: This exam was performed according to the departmental dose-optimization program which includes automated exposure control, adjustment of the mA and/or kV according to patient size and/or use of iterative reconstruction technique. COMPARISON:  Radiograph earlier today. FINDINGS:  Bones/Joint/Cartilage No fracture or dislocation. Normal alignment. Normal joint spaces. No joint effusion. Ligaments Suboptimally assessed by CT. Muscles and Tendons No intramuscular hematoma. Normal fatty striations persist. Soft tissues Mild soft tissue edema posteriorly. No confluent soft tissue hematoma. IMPRESSION: 1. No fracture, dislocation, or joint effusion. 2. Soft tissue edema posteriorly. Electronically Signed   By: Keith Rake M.D.   On: 02/09/2022 23:23   DG Elbow Complete Left  Result Date: 02/09/2022 CLINICAL DATA:  Pain with movement after elbow trauma EXAM: LEFT ELBOW - COMPLETE 3+ VIEW COMPARISON:  None Available. FINDINGS: There is no evidence of fracture, dislocation, or joint effusion. There is no evidence of arthropathy or other focal bone abnormality. Soft tissues are unremarkable. IMPRESSION: No acute fracture or dislocation. Electronically Signed   By: Placido Sou M.D.   On: 02/09/2022 21:56    Procedures Procedures    Medications Ordered in ED Medications  ibuprofen (ADVIL) tablet 800 mg (800 mg Oral Given 02/09/22 2318)    ED Course/ Medical Decision Making/ A&P                             Medical Decision Making Amount and/or Complexity of Data Reviewed Radiology: ordered.  Risk Prescription drug management.    22 year old female presenting to the emergency department with left elbow pain.  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  She has since had pain with range of motion and tenderness to palpation of the olecranon along her elbow.  She denies any loss of sensation or motor function in the left upper extremity.  She is right-handed.  She denies any other injury or trauma.  On arrival, the patient was vitally stable.  Physical exam significant for Left upper extremity neurovascularly intact with 2+ radial pulses, intact motor function along the median, ulnar, radial  nerve distributions.  No tenderness about the medial or lateral epicondyles, tenderness upon direct palpation of the olecranon.  Intact passive range of motion.  Patient was administered Motrin for pain control.  X-ray imaging was performed of the left elbow which resulted negative for acute fracture or dislocation.  Given the patient's acute tenderness, CT imaging was performed and results indicated: IMPRESSION:  1. No fracture, dislocation, or joint effusion.  2. Soft tissue edema posteriorly.    Final Clinical Impression(s) / ED Diagnoses Final diagnoses:  Left elbow pain    Rx / DC Orders ED Discharge Orders     None         Regan Lemming, MD 02/09/22 2326

## 2022-02-09 NOTE — ED Notes (Signed)
Pt verbalized understanding of d/c instructions, meds, and followup care. Denies questions. VSS, no distress noted. Steady gait to exit with all belongings.  ?

## 2022-02-09 NOTE — ED Triage Notes (Signed)
POV from home, pt sts that she hit her left elbow on computer at work, pain with movement. Sensation intact.  GCS 15, amb to triage room, NAD, A&O x4.

## 2022-02-09 NOTE — Discharge Instructions (Addendum)
Recommend rest, ice, elevation of the extremity, Tylenol and ibuprofen for pain control. CT Elbow results: IMPRESSION:  1. No fracture, dislocation, or joint effusion.  2. Soft tissue edema posteriorly.

## 2022-05-30 ENCOUNTER — Encounter: Payer: Self-pay | Admitting: Nurse Practitioner

## 2022-05-30 ENCOUNTER — Ambulatory Visit (INDEPENDENT_AMBULATORY_CARE_PROVIDER_SITE_OTHER): Payer: 59 | Admitting: Nurse Practitioner

## 2022-05-30 VITALS — BP 110/72 | HR 66 | Ht 66.5 in | Wt 202.0 lb

## 2022-05-30 DIAGNOSIS — Z3045 Encounter for surveillance of transdermal patch hormonal contraceptive device: Secondary | ICD-10-CM

## 2022-05-30 DIAGNOSIS — Z01419 Encounter for gynecological examination (general) (routine) without abnormal findings: Secondary | ICD-10-CM

## 2022-05-30 MED ORDER — NORELGESTROMIN-ETH ESTRADIOL 150-35 MCG/24HR TD PTWK: 1.0000 | MEDICATED_PATCH | TRANSDERMAL | 3 refills | Status: DC

## 2022-05-30 NOTE — Progress Notes (Signed)
   Lynn Austin [birth year] [id number]   History:  22 y.o. G0 presents for annual exam. Monthly cycles, on Xulane patch. Normal pap history. Has received Gardasil series.  Gynecologic History Patient's last menstrual period was 05/06/2022. Period Cycle (Days): 28 Period Duration (Days): 4-5 Period Pattern: Regular Menstrual Flow: Moderate Menstrual Control: Maxi pad Menstrual Control Change Freq (Hours): 6 Dysmenorrhea: (!) Mild Dysmenorrhea Symptoms: Cramping, Nausea, Diarrhea Contraception/Family planning: abstinence, Xulane patch Sexually active: No  Health Maintenance Last Pap: 04/13/2020. 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  Exam:  Vitals:   05/30/22 1512  BP: 110/72  Pulse: 66  SpO2: 100%  Weight: 202 lb (91.6 kg)  Height: 5' 6.5" (1.689 m)     Body mass index is 32.12 kg/m.  General appearance:  Normal Thyroid:  Symmetrical, normal in size, without palpable masses or nodularity. Respiratory  Auscultation:  Clear without wheezing or rhonchi Cardiovascular  Auscultation:  Regular rate, without rubs, murmurs or gallops  Edema/varicosities:  Not grossly evident Abdominal  Soft,nontender, without masses, guarding or rebound.  Liver/spleen:  No organomegaly noted  Hernia:  None appreciated  Skin  Inspection:  Grossly normal   Breasts: Not indicated per guidelines Genitourinary   Inguinal/mons:  Normal without inguinal adenopathy  External genitalia:  Normal appearing vulva with no masses, tenderness, or lesions  BUS/Urethra/Skene's glands:  Normal  Vagina:  Normal appearing with normal color and discharge, no lesions  Cervix:  Normal appearing without discharge or  lesions  Uterus:  Normal in size, shape and contour.  Midline and mobile, nontender  Adnexa/parametria:     Rt: Normal in size, without masses or tenderness.   Lt: Normal in size, without masses or tenderness.  Anus and perineum: Normal  Digital rectal exam: Not indicated  Patient informed chaperone available to be present for breast and pelvic exam. Patient has requested no chaperone to be present. Patient has been advised what will be completed during breast and pelvic exam.    Assessment/Plan:  22 y.o. G0 for annual exam.   Well female exam with routine gynecological exam - Education provided on SBEs, importance of preventative screenings, current guidelines, high calcium diet, safe sex, regular exercise, and multivitamin daily.  Encounter for surveillance of transdermal patch hormonal contraceptive device - Plan: norelgestromin-ethinyl estradiol [employer]) 150-35 MCG/24HR transdermal patch weekly. Using as prescribed. Refill x 1 year provided.   Screening for cervical cancer - Normal pap history. Will repeat at 3-year interval per guidelines.   Return in 1 year for annual.      Olivia Mackie DNP, 3:33 PM 05/30/2022

## 2022-06-07 ENCOUNTER — Ambulatory Visit
Admission: EM | Admit: 2022-06-07 | Discharge: 2022-06-07 | Disposition: A | Discharge status: Home / Self Care | Payer: 59

## 2022-06-07 DIAGNOSIS — J309 Allergic rhinitis, unspecified: Secondary | ICD-10-CM

## 2022-06-07 NOTE — ED Triage Notes (Signed)
Pt presents to UC w/ c/o sinus pressure in nose and forehead, nasal congestion x4 days. Home covid test negative 3 days ago.

## 2022-06-07 NOTE — Discharge Instructions (Signed)
Continue your nasal sprays and over-the-counter sinus/decongestants.  Has she been on cetirizine for several months I would recommend you switch to Claritin or Allegra Nasal rinses as tolerated Rest and fluids Follow-up with your PCP if your symptoms do not improve Please go to the ER for any worsening symptoms

## 2022-06-07 NOTE — ED Provider Notes (Signed)
[facility]    CSN: [id number] Arrival date & time: 06/07/22  0801      History   Chief Complaint No chief complaint on file.   HPI Lynn Austin is a 22 y.o. female  presents for evaluation of URI symptoms for 4 days. Patient reports associated symptoms of sinus pressure/congestion, sneezing. Denies N/V/D, cough, sore throat, fevers, chills, body aches, ear pain, shortness of breath. Patient does not have a hx of asthma or smoking.  She does have history of allergies and has been on cetirizine for several months.  No known sick contacts.  Pt has taken sinus decongestants OTC for symptoms. Pt has no other concerns at this time.   HPI  Past Medical History:  Diagnosis Date   Anxiety    Depression     Patient Active Problem List   Diagnosis Date Noted   Dysmenorrhea 11/14/2013    Past Surgical History:  Procedure Laterality Date   CARDIAC ELECTROPHYSIOLOGY STUDY AND ABLATION  04/29/2015   WRIST SURGERY Left     OB History     Gravida  0   Para  0   Term  0   Preterm  0   AB  0   Living  0      SAB  0   IAB  0   Ectopic  0   Multiple  0   Live Births  0            Home Medications    Prior to Admission medications   Medication Sig Start Date End Date Taking? Authorizing Provider  Cetirizine HCl (ZYRTEC PO) Take by mouth.    [provider]  escitalopram (LEXAPRO) 20 MG tablet Take 20 mg by mouth daily. 03/31/21   [provider]  ibuprofen (ADVIL,MOTRIN) 200 MG tablet Take 600 mg by mouth every 6 (six) hours as needed for moderate pain.    [provider]  Multiple Vitamin (MULTIVITAMIN) capsule Take 1 capsule by mouth daily.    [provider]  norelgestromin-ethinyl estradiol [employer]) 150-35 MCG/24HR transdermal patch Place 1 patch onto the skin once a week. 05/30/22   Olivia Mackie, NP    Family History Family History  Problem Relation Age of Onset   Hypertension Father    Diabetes  Maternal Grandmother    Hypertension Maternal Grandmother    Cancer Maternal Grandfather        Lung    Social History Social History   Tobacco Use   Smoking status: Never   Smokeless tobacco: Never  Vaping Use   Vaping Use: Never used  Substance Use Topics   Alcohol use: Yes    Alcohol/week: 0.0 standard drinks of alcohol    Comment: Occas   Drug use: No     Allergies   Amoxicillin and Fruit & vegetable daily [nutritional supplements]   Review of Systems Review of Systems  HENT:  Positive for congestion, sinus pressure and sinus pain.      Physical Exam Triage Vital Signs ED Triage Vitals [06/07/22 0830]  Enc Vitals Group     BP 117/80     Pulse Rate 92     Resp 16     Temp 98.5 F (36.9 C)     Temp Source Oral     SpO2 98 %     Weight      Height      Head Circumference      Peak Flow  Pain Score      Pain Loc      Pain Edu?      Excl. in GC?    No data found.  Updated Vital Signs BP 117/80 (BP Location: Right Arm)   Pulse 92   Temp 98.5 F (36.9 C) (Oral)   Resp 16   LMP 06/01/2022 (Approximate)   SpO2 98%   Visual Acuity Right Eye Distance:   Left Eye Distance:   Bilateral Distance:    Right Eye Near:   Left Eye Near:    Bilateral Near:     Physical Exam Vitals and nursing note reviewed.  Constitutional:      General: She is not in acute distress.    Appearance: She is well-developed. She is not ill-appearing.  HENT:     Head: Normocephalic and atraumatic.     Right Ear: Tympanic membrane and ear canal normal.     Left Ear: Tympanic membrane and ear canal normal.     Nose: Congestion present.     Right Turbinates: Not swollen or pale.     Left Turbinates: Not swollen or pale.     Right Sinus: Frontal sinus tenderness present. No maxillary sinus tenderness.     Left Sinus: Frontal sinus tenderness present. No maxillary sinus tenderness.     Mouth/Throat:     Mouth: Mucous membranes are moist.     Pharynx: Oropharynx is  clear. Uvula midline. No oropharyngeal exudate or posterior oropharyngeal erythema.     Tonsils: No tonsillar exudate or tonsillar abscesses.  Eyes:     Conjunctiva/sclera: Conjunctivae normal.     Pupils: Pupils are equal, round, and reactive to light.  Cardiovascular:     Rate and Rhythm: Normal rate and regular rhythm.     Heart sounds: Normal heart sounds.  Pulmonary:     Effort: Pulmonary effort is normal.     Breath sounds: Normal breath sounds.  Musculoskeletal:     Cervical back: Normal range of motion and neck supple.  Lymphadenopathy:     Cervical: No cervical adenopathy.  Skin:    General: Skin is warm and dry.  Neurological:     General: No focal deficit present.     Mental Status: She is alert and oriented to person, place, and time.  Psychiatric:        Mood and Affect: Mood normal.        Behavior: Behavior normal.      UC Treatments / Results  Labs (all labs ordered are listed, but only abnormal results are displayed) Labs Reviewed - No data to display  EKG   Radiology No results found.  Procedures Procedures (including critical care time)  Medications Ordered in UC Medications - No data to display  Initial Impression / Assessment and Plan / UC Course  I have reviewed the triage vital signs and the nursing notes.  Pertinent labs & imaging results that were available during my care of the patient were reviewed by me and considered in my medical decision making (see chart for details).     Reviewed exam and symptoms with patient.  Discussed allergy induced sinusitis/allergic rhinitis Continue Flonase and OTC decongestants as needed.  Discussed switching from cetirizine to Claritin or Allegra Nasal rinses as tolerated Follow-up with PCP if symptoms do not improve ER precautions reviewed and patient verbalized understanding Final Clinical Impressions(s) / UC Diagnoses   Final diagnoses:  Allergic sinusitis  Allergic rhinitis, unspecified  seasonality, unspecified trigger  Discharge Instructions      Continue your nasal sprays and over-the-counter sinus/decongestants.  Has she been on cetirizine for several months I would recommend you switch to Claritin or Allegra Nasal rinses as tolerated Rest and fluids Follow-up with your PCP if your symptoms do not improve Please go to the ER for any worsening symptoms   ED Prescriptions   None    PDMP not reviewed this encounter.   Radford Pax, NP 06/07/22 [phone]

## 2023-05-07 ENCOUNTER — Other Ambulatory Visit: Payer: Self-pay | Admitting: Nurse Practitioner

## 2023-05-07 DIAGNOSIS — Z3045 Encounter for surveillance of transdermal patch hormonal contraceptive device: Secondary | ICD-10-CM

## 2023-05-08 NOTE — Telephone Encounter (Signed)
 Medication refill request: xulane   Last AEX:  05/30/22  Next AEX: not yet scheduled  Last MMG (if hormonal medication request): na  Refill authorized: please advise.

## 2023-08-13 ENCOUNTER — Other Ambulatory Visit: Payer: Self-pay | Admitting: Nurse Practitioner

## 2023-08-13 DIAGNOSIS — Z3045 Encounter for surveillance of transdermal patch hormonal contraceptive device: Secondary | ICD-10-CM

## 2023-08-14 NOTE — Telephone Encounter (Signed)
.  Med refill request: Xulane   Last AEX: 05/30/22 Next AEX: 10/28/23 Last MMG (if hormonal med)  Refill authorized: Please Advise?

## 2023-10-18 ENCOUNTER — Other Ambulatory Visit: Payer: Self-pay | Admitting: Family Medicine

## 2023-10-18 ENCOUNTER — Encounter: Payer: Self-pay | Admitting: Family Medicine

## 2023-10-18 ENCOUNTER — Encounter: Payer: Self-pay | Admitting: Nurse Practitioner

## 2023-10-18 ENCOUNTER — Ambulatory Visit: Admitting: Nurse Practitioner

## 2023-10-18 ENCOUNTER — Other Ambulatory Visit (HOSPITAL_COMMUNITY)
Admission: RE | Admit: 2023-10-18 | Discharge: 2023-10-18 | Disposition: A | Discharge status: Home / Self Care | Source: Ambulatory Visit | Attending: Nurse Practitioner | Admitting: Nurse Practitioner

## 2023-10-18 VITALS — BP 112/72 | HR 95 | Ht 66.0 in | Wt 218.0 lb

## 2023-10-18 DIAGNOSIS — N898 Other specified noninflammatory disorders of vagina: Secondary | ICD-10-CM | POA: Diagnosis not present

## 2023-10-18 DIAGNOSIS — Z1331 Encounter for screening for depression: Secondary | ICD-10-CM

## 2023-10-18 DIAGNOSIS — Z124 Encounter for screening for malignant neoplasm of cervix: Secondary | ICD-10-CM | POA: Diagnosis present

## 2023-10-18 DIAGNOSIS — Z01419 Encounter for gynecological examination (general) (routine) without abnormal findings: Secondary | ICD-10-CM | POA: Diagnosis present

## 2023-10-18 DIAGNOSIS — Z3045 Encounter for surveillance of transdermal patch hormonal contraceptive device: Secondary | ICD-10-CM

## 2023-10-18 DIAGNOSIS — R590 Localized enlarged lymph nodes: Secondary | ICD-10-CM

## 2023-10-18 LAB — WET PREP FOR TRICH, YEAST, CLUE

## 2023-10-18 MED ORDER — NORELGESTROMIN-ETH ESTRADIOL 150-35 MCG/24HR TD PTWK: 1.0000 | MEDICATED_PATCH | TRANSDERMAL | 4 refills | Status: DC

## 2023-10-18 NOTE — Progress Notes (Signed)
   Lynn Austin 18-Apr-2020 [id number]   History:  23 y.o. G0 presents for annual exam. Monthly cycles, on Xulane  patch. Normal pap history. Has received Gardasil series. Notices an intermittent vaginal odor. Denies discharge or itching. Not sexually active.   Gynecologic History Patient's last menstrual period was 09/26/2023 (exact date). Period Duration (Days): 4-5 Period Pattern: Regular Menstrual Flow: Moderate Menstrual Control: Maxi pad Dysmenorrhea: (!) Moderate (moderate to severe cramping) Dysmenorrhea Symptoms: Cramping Contraception/Family planning: abstinence, Xulane  patch Sexually active: No  Health Maintenance Last Pap: 04/13/2020. 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   ROS:  A ROS was performed and pertinent positives and negatives are included.  Exam:  Vitals:   10/18/23 1601  BP: 112/72  Pulse: 95  SpO2: 97%  Weight: 218 lb (98.9 kg)  Height: 5' 6 (1.676 m)      Body mass index is 35.19 kg/m.  General appearance:  Normal Thyroid:  Symmetrical, normal in size, without palpable masses or nodularity. Respiratory  Auscultation:  Clear without wheezing or rhonchi Cardiovascular  Auscultation:  Regular rate, without rubs, murmurs or gallops  Edema/varicosities:  Not grossly evident Abdominal  Soft,nontender, without masses, guarding or rebound.  Liver/spleen:  No organomegaly noted  Hernia:  None appreciated  Skin  Inspection:  Grossly normal Breasts: Not indicated per guidelines Pelvic: External genitalia:  no lesions              Urethra:  normal appearing urethra with no masses, tenderness or lesions              Bartholins and Skenes: normal                 Vagina: normal appearing  vagina with normal color and discharge, no lesions              Cervix: no lesions Bimanual Exam:  Uterus:  no masses or tenderness              Adnexa: no mass, fullness, tenderness              Rectovaginal: Deferred              Anus:  normal, no lesions  Zada Louder, CMA present as chaperone.   Wet prep negative for pathogens  Assessment/Plan:  23 y.o. G0 for annual exam.   Well female exam with routine gynecological exam - Education provided on SBEs, importance of preventative screenings, current guidelines, high calcium diet, safe sex, regular exercise, and multivitamin daily.   Encounter for surveillance of transdermal patch hormonal contraceptive device - Plan: norelgestromin -ethinyl estradiol  (XULANE ) 150-35 MCG/24HR transdermal patch weekly. Using as prescribed. Refill x 1 year provided.   Vaginal odor - Plan: WET PREP FOR TRICH, YEAST, CLUE. Negative wet prep, normal exam.   Cervical cancer screening - Plan: Cytology - PAP( [place]). Normal pap history. Pap today per guidelines.   Return in about 1 year (around 10/17/2024) for Annual.      Lynn DELENA Shutter DNP, 4:35 PM 10/18/2023

## 2023-10-24 LAB — CYTOLOGY - PAP: Diagnosis: NEGATIVE

## 2023-10-25 ENCOUNTER — Ambulatory Visit: Payer: Self-pay | Admitting: Nurse Practitioner

## 2023-10-27 ENCOUNTER — Ambulatory Visit
Admission: RE | Admit: 2023-10-27 | Discharge: 2023-10-27 | Disposition: A | Discharge status: Home / Self Care | Source: Ambulatory Visit | Attending: Family Medicine | Admitting: Family Medicine

## 2023-10-27 DIAGNOSIS — R590 Localized enlarged lymph nodes: Secondary | ICD-10-CM

## 2023-10-27 MED ORDER — IOPAMIDOL (ISOVUE-300) INJECTION 61%
80.0000 mL | Freq: Once | INTRAVENOUS | Status: AC | PRN
  Administered 2023-10-27: 80 mL via INTRAVENOUS

## 2023-10-31 ENCOUNTER — Other Ambulatory Visit: Payer: Self-pay

## 2023-10-31 DIAGNOSIS — Z3045 Encounter for surveillance of transdermal patch hormonal contraceptive device: Secondary | ICD-10-CM

## 2023-10-31 NOTE — Telephone Encounter (Signed)
 Med refill request: Xulane   Last AEX: 10/18/23 Next AEX: 10/18/24 Last MMG (if hormonal med) Refill authorized: Patient's father left message in refill line stating that medication was sent to [facility] on [address] instead of [facility] for 90 day supply like normal. Requesting new rx be sent though mail order. Please advise. Last rx 10/18/23 #9 with 4 refills.

## 2023-11-01 MED ORDER — NORELGESTROMIN-ETH ESTRADIOL 150-35 MCG/24HR TD PTWK: 1.0000 | MEDICATED_PATCH | TRANSDERMAL | 4 refills | Status: AC

## 2024-10-18 ENCOUNTER — Ambulatory Visit: Admitting: Nurse Practitioner
